# Patient Record
Sex: Female | Born: 1970 | Race: White | Hispanic: No | Marital: Married | Smoking: Never smoker
Health system: Southern US, Community
[De-identification: ages and names within clinical notes are randomized; demographics above are authoritative.]

## PROBLEM LIST (undated history)

## (undated) DIAGNOSIS — T8859XA Other complications of anesthesia, initial encounter: Secondary | ICD-10-CM

## (undated) DIAGNOSIS — T4145XA Adverse effect of unspecified anesthetic, initial encounter: Secondary | ICD-10-CM

## (undated) DIAGNOSIS — K589 Irritable bowel syndrome without diarrhea: Secondary | ICD-10-CM

## (undated) DIAGNOSIS — E119 Type 2 diabetes mellitus without complications: Secondary | ICD-10-CM

## (undated) DIAGNOSIS — N200 Calculus of kidney: Secondary | ICD-10-CM

## (undated) HISTORY — DX: Irritable bowel syndrome without diarrhea: K58.9

## (undated) HISTORY — PX: KIDNEY STONE SURGERY: SHX686

## (undated) HISTORY — PX: OTHER SURGICAL HISTORY: SHX169

## (undated) HISTORY — DX: Type 2 diabetes mellitus without complications: E11.9

## (undated) HISTORY — DX: Calculus of kidney: N20.0

---

## 2015-03-27 ENCOUNTER — Encounter (INDEPENDENT_AMBULATORY_CARE_PROVIDER_SITE_OTHER): Payer: Self-pay | Admitting: *Deleted

## 2015-04-07 ENCOUNTER — Encounter (INDEPENDENT_AMBULATORY_CARE_PROVIDER_SITE_OTHER): Payer: Self-pay | Admitting: *Deleted

## 2015-04-30 ENCOUNTER — Ambulatory Visit (INDEPENDENT_AMBULATORY_CARE_PROVIDER_SITE_OTHER): Payer: Self-pay | Admitting: Internal Medicine

## 2015-05-01 ENCOUNTER — Ambulatory Visit (INDEPENDENT_AMBULATORY_CARE_PROVIDER_SITE_OTHER): Payer: Self-pay | Admitting: Internal Medicine

## 2015-05-07 ENCOUNTER — Ambulatory Visit (INDEPENDENT_AMBULATORY_CARE_PROVIDER_SITE_OTHER): Payer: BLUE CROSS/BLUE SHIELD | Admitting: Internal Medicine

## 2015-05-07 ENCOUNTER — Encounter (INDEPENDENT_AMBULATORY_CARE_PROVIDER_SITE_OTHER): Payer: Self-pay | Admitting: *Deleted

## 2015-05-07 ENCOUNTER — Encounter (INDEPENDENT_AMBULATORY_CARE_PROVIDER_SITE_OTHER): Payer: Self-pay | Admitting: Internal Medicine

## 2015-05-07 VITALS — BP 110/68 | HR 84 | Temp 98.8°F | Ht 62.0 in | Wt 151.8 lb

## 2015-05-07 DIAGNOSIS — E119 Type 2 diabetes mellitus without complications: Secondary | ICD-10-CM | POA: Insufficient documentation

## 2015-05-07 DIAGNOSIS — R1310 Dysphagia, unspecified: Secondary | ICD-10-CM | POA: Diagnosis not present

## 2015-05-07 DIAGNOSIS — K589 Irritable bowel syndrome without diarrhea: Secondary | ICD-10-CM | POA: Diagnosis not present

## 2015-05-07 DIAGNOSIS — N200 Calculus of kidney: Secondary | ICD-10-CM | POA: Insufficient documentation

## 2015-05-07 MED ORDER — HYOSCYAMINE SULFATE 0.125 MG SL SUBL: 0.1250 mg | SUBLINGUAL_TABLET | SUBLINGUAL | Status: DC | PRN

## 2015-05-07 NOTE — Progress Notes (Addendum)
   Subjective:    Patient ID: Cassandra Anderson, female    DOB: 06/22/1970, 45 y.o.   MRN: [id number]  HPI Referred by [facility]  (Dr. Leeroy BockBalaji Desai)for IBS with Diarrhea. She has seen Dr. Aleene DavidsonSpainhour in the past for this. In January for about 2 weeks she had diarrhea with no appetite. She felt nausea. She tells me for the past 2 weeks, when she eats,  It feels like foods are lodging.  She feels like foods are lodging in her mid-esophagus. When this occurs, she will have nausea.   She says her IBS has increased. She is having on average x 2 a day. She does not have a BM every day. She has urgency when she has diarrhea.  After her hysterectomy she went from constipation/diarrhea.  Family hx of colon polyps.  Her last colonoscopy was 10/05/2010 by Dr. Aleene DavidsonSpainhour and was normal.  Appetite is good. She has weight loss which was intentional.  No acid reflux.   Diabetic x 1 year.  Review of Systems Past Medical History  Diagnosis Date  . Diabetes (HCC)   . IBS (irritable bowel syndrome)     Past Surgical History  Procedure Laterality Date  . Partial hysterectomy      2014: abdominal pain  . Kidney stone surgery      Lithrotrypsy,   . C section      x 2    Allergies  Allergen Reactions  . Other     Nuts. Tree nuts.     No current outpatient prescriptions on file prior to visit.   No current facility-administered medications on file prior to visit.   Current Outpatient Prescriptions  Medication Sig Dispense Refill  . chlorthalidone (HYGROTON) 25 MG tablet Take 25 mg by mouth daily.    Marland Kitchen. lisinopril (PRINIVIL,ZESTRIL) 2.5 MG tablet Take 2.5 mg by mouth daily.    . metFORMIN (GLUCOPHAGE-XR) 750 MG 24 hr tablet Take 750 mg by mouth every evening.    . sertraline (ZOLOFT) 25 MG tablet Take 25 mg by mouth as needed.    . sitaGLIPtin (JANUVIA) 50 MG tablet Take 50 mg by mouth daily.     No current facility-administered medications for this visit.        Objective:   Physical Exam Blood pressure 110/68, pulse 84, temperature 98.8 F (37.1 C), height 5\' 2"  (1.575 m), weight 151 lb 12.8 oz (68.856 kg). Alert and oriented. Skin warm and dry. Oral mucosa is moist.   . Sclera anicteric, conjunctivae is pink. Thyroid not enlarged. No cervical lymphadenopathy. Lungs clear. Heart regular rate and rhythm.  Abdomen is soft. Bowel sounds are positive. No hepatomegaly. No abdominal masses felt. No tenderness.  No edema to lower extremities.          Assessment & Plan:  IBS. Am going to try her on Levsin and she how she does.  She does not have any alarm symptoms. Her symptoms have remained the same. Colonoscopy in 2012 for IBS diarrhea by Dr. Aleene DavidsonSpainhour was normal.  Bloating: Samples of Nexium x 4 boxes and IBGARD x 5 given to patient.  Dysphagia: DG esophagram

## 2015-05-07 NOTE — Patient Instructions (Signed)
IBGARD samples. Rx for Levsin.  OV in 3 months

## 2015-05-08 ENCOUNTER — Encounter (INDEPENDENT_AMBULATORY_CARE_PROVIDER_SITE_OTHER): Payer: Self-pay

## 2015-05-08 ENCOUNTER — Ambulatory Visit (HOSPITAL_COMMUNITY)
Admission: RE | Admit: 2015-05-08 | Discharge: 2015-05-08 | Disposition: A | Discharge status: Home / Self Care | Payer: BLUE CROSS/BLUE SHIELD | Source: Ambulatory Visit | Attending: Internal Medicine | Admitting: Internal Medicine

## 2015-05-08 DIAGNOSIS — R1310 Dysphagia, unspecified: Secondary | ICD-10-CM | POA: Diagnosis not present

## 2015-05-21 ENCOUNTER — Encounter (INDEPENDENT_AMBULATORY_CARE_PROVIDER_SITE_OTHER): Payer: Self-pay | Admitting: *Deleted

## 2015-08-07 ENCOUNTER — Ambulatory Visit (INDEPENDENT_AMBULATORY_CARE_PROVIDER_SITE_OTHER): Payer: BLUE CROSS/BLUE SHIELD | Admitting: Internal Medicine

## 2015-08-14 ENCOUNTER — Ambulatory Visit (INDEPENDENT_AMBULATORY_CARE_PROVIDER_SITE_OTHER): Payer: BLUE CROSS/BLUE SHIELD | Admitting: Internal Medicine

## 2016-04-12 ENCOUNTER — Ambulatory Visit (INDEPENDENT_AMBULATORY_CARE_PROVIDER_SITE_OTHER): Payer: BLUE CROSS/BLUE SHIELD | Admitting: Internal Medicine

## 2016-04-12 ENCOUNTER — Encounter (INDEPENDENT_AMBULATORY_CARE_PROVIDER_SITE_OTHER): Payer: Self-pay | Admitting: Internal Medicine

## 2016-04-12 ENCOUNTER — Encounter (INDEPENDENT_AMBULATORY_CARE_PROVIDER_SITE_OTHER): Payer: Self-pay

## 2016-04-12 VITALS — BP 116/80 | HR 78 | Temp 98.6°F | Resp 18 | Ht 62.0 in | Wt 157.9 lb

## 2016-04-12 DIAGNOSIS — R1013 Epigastric pain: Secondary | ICD-10-CM | POA: Diagnosis not present

## 2016-04-12 DIAGNOSIS — K582 Mixed irritable bowel syndrome: Secondary | ICD-10-CM

## 2016-04-12 MED ORDER — DICYCLOMINE HCL 10 MG PO CAPS: 10.0000 mg | ORAL_CAPSULE | Freq: Two times a day (BID) | ORAL | 5 refills | Status: AC

## 2016-04-12 NOTE — Patient Instructions (Addendum)
Hemoccult 1. Gradually increase intake of fiber rich foods. Upper abdominal ultrasound to be scheduled.

## 2016-04-12 NOTE — Progress Notes (Signed)
Presenting complaint;  Epigastric pain and irregular bowel movements.  Subjective:  Cassandra Anderson is 46 year old Caucasian female who is here for scheduled visit. She was last seen in March 2017 by Ms. Setzer NP. She was felt to have IBS. She also had barium pill study but was normal. Patient states that she does not have dysphagia but she has postprandial midepigastric pain which may last from 30 minutes to several hours. This pain is associated with nausea and sporadic vomiting. He does not have heartburn. She has not experienced melena or rectal bleeding. She has occasional pain under the right rib cage which radiates towards right scapula. She takes ibuprofen no more than 3-4 times in a month. She takes 4-600 mg at a time. She continues to complain of irregular bowel movements. When she is constipated she may go 3 days without a bowel movement and when she has diarrhea she has as many as 4-5 stools and she also has urgency. She had an accident 1 year ago. She does not believe she is on a high fiber diet. She does not exercise regularly. She drinks alcohol occasionally but does not smoke cigarettes. She has been on metformin for about 2 years. She does not feel that she is having any side effects with metformin. He has gained 6 pounds since her last visit of March 2017. On her last visit she was given IBgard doesn't believe it helps. She does not remember if she took Nexium.   Current Medications: Outpatient Encounter Prescriptions as of 04/12/2016  Medication Sig  . chlorthalidone (HYGROTON) 25 MG tablet Take 25 mg by mouth daily.  Marland Kitchen. ibuprofen (ADVIL,MOTRIN) 200 MG tablet Take 200 mg by mouth as needed for headache.  . lisinopril (PRINIVIL,ZESTRIL) 2.5 MG tablet Take 2.5 mg by mouth daily.  . metFORMIN (GLUCOPHAGE-XR) 750 MG 24 hr tablet Take 750 mg by mouth every evening.  . sitaGLIPtin (JANUVIA) 100 MG tablet Take 100 mg by mouth daily.   . [DISCONTINUED] hyoscyamine (LEVSIN SL) 0.125 MG SL tablet  Place 1 tablet (0.125 mg total) under the tongue every 4 (four) hours as needed. (Patient not taking: Reported on 04/12/2016)  . [DISCONTINUED] sertraline (ZOLOFT) 25 MG tablet Take 25 mg by mouth as needed.   No facility-administered encounter medications on file as of 04/12/2016.      Objective: Blood pressure 116/80, pulse 78, temperature 98.6 F (37 C), temperature source Oral, resp. rate 18, height 5\' 2"  (1.575 m), weight 157 lb 14.4 oz (71.6 kg). Patient is alert and in no acute distress. Conjunctiva is pink. Sclera is nonicteric Oropharyngeal mucosa is normal. No neck masses or thyromegaly noted. Cardiac exam with regular rhythm normal S1 and S2. No murmur or gallop noted. Lungs are clear to auscultation. Abdomen is symmetrical. Bowel sounds are normal. On palpation abdomen is soft with mild tenderness in midepigastric region. No organomegaly or masses. No LE edema or clubbing noted.   Assessment:  #1. Postprandial epigastric pain. She does not have heartburn. Need to rule out gallbladder disease. His pain could be part and parcel of IBS. #2. Irritable bowel syndrome. She is having both diarrhea and constipation.   Plan:  Hemoccult 1. Upper abdominal ultrasound. Dicyclomine 10 mg twice a day. High fiber diet. Further recommendations to follow.

## 2016-04-20 ENCOUNTER — Telehealth (INDEPENDENT_AMBULATORY_CARE_PROVIDER_SITE_OTHER): Payer: Self-pay | Admitting: Internal Medicine

## 2016-04-20 NOTE — Telephone Encounter (Signed)
Dr.Rehman is waiitng for the DVD to arrive at our office. After he reviews the DVD he will call the patient to review results.

## 2016-04-20 NOTE — Telephone Encounter (Signed)
Patient called, stated that Dr. Karilyn Cotaehman called her yesterday with her ultrasound results, but he called her home number and not her cell phone so it was late when she got the message.  She would like someone to call her back, she would like to know her results.  [phone]

## 2016-04-21 NOTE — Telephone Encounter (Signed)
I called the patient and explained Cassandra Anderson's response to the encounter.  She stated that when Dr. Karilyn Cotaehman called her, he called her home number.  She requested that I take her home # out so she will not miss his call.  She was anxious to get the results.

## 2016-05-02 ENCOUNTER — Other Ambulatory Visit (INDEPENDENT_AMBULATORY_CARE_PROVIDER_SITE_OTHER): Payer: Self-pay | Admitting: Internal Medicine

## 2016-05-02 DIAGNOSIS — R9389 Abnormal findings on diagnostic imaging of other specified body structures: Secondary | ICD-10-CM

## 2016-05-03 ENCOUNTER — Encounter (INDEPENDENT_AMBULATORY_CARE_PROVIDER_SITE_OTHER): Payer: Self-pay

## 2016-05-09 ENCOUNTER — Ambulatory Visit (HOSPITAL_COMMUNITY)
Admission: RE | Admit: 2016-05-09 | Discharge: 2016-05-09 | Disposition: A | Discharge status: Home / Self Care | Payer: BLUE CROSS/BLUE SHIELD | Source: Ambulatory Visit | Attending: Internal Medicine | Admitting: Internal Medicine

## 2016-05-09 DIAGNOSIS — K760 Fatty (change of) liver, not elsewhere classified: Secondary | ICD-10-CM | POA: Insufficient documentation

## 2016-05-09 DIAGNOSIS — R160 Hepatomegaly, not elsewhere classified: Secondary | ICD-10-CM | POA: Insufficient documentation

## 2016-05-09 DIAGNOSIS — R9389 Abnormal findings on diagnostic imaging of other specified body structures: Secondary | ICD-10-CM

## 2016-05-09 DIAGNOSIS — R938 Abnormal findings on diagnostic imaging of other specified body structures: Secondary | ICD-10-CM | POA: Diagnosis present

## 2016-05-09 LAB — POCT I-STAT CREATININE: Creatinine, Ser: 0.6 mg/dL (ref 0.44–1.00)

## 2016-05-09 MED ORDER — GADOBENATE DIMEGLUMINE 529 MG/ML IV SOLN
15.0000 mL | Freq: Once | INTRAVENOUS | Status: AC | PRN
  Administered 2016-05-09: 14 mL via INTRAVENOUS

## 2016-05-10 ENCOUNTER — Telehealth (INDEPENDENT_AMBULATORY_CARE_PROVIDER_SITE_OTHER): Payer: Self-pay | Admitting: Internal Medicine

## 2016-05-10 NOTE — Telephone Encounter (Signed)
Dr.Rehman is aware and he is going to call the patient with her results.

## 2016-05-10 NOTE — Telephone Encounter (Signed)
Patient left a message wanting to see if Dr. Karilyn Cotaehman has her MRI results yet.  [phone]

## 2016-05-11 ENCOUNTER — Encounter (INDEPENDENT_AMBULATORY_CARE_PROVIDER_SITE_OTHER): Payer: Self-pay | Admitting: Internal Medicine

## 2016-05-12 ENCOUNTER — Other Ambulatory Visit (INDEPENDENT_AMBULATORY_CARE_PROVIDER_SITE_OTHER): Payer: Self-pay | Admitting: *Deleted

## 2016-05-12 DIAGNOSIS — R1013 Epigastric pain: Secondary | ICD-10-CM

## 2016-05-17 ENCOUNTER — Encounter (HOSPITAL_COMMUNITY): Payer: Self-pay

## 2016-05-17 ENCOUNTER — Encounter (HOSPITAL_COMMUNITY)
Admission: RE | Admit: 2016-05-17 | Discharge: 2016-05-17 | Disposition: A | Discharge status: Home / Self Care | Payer: BLUE CROSS/BLUE SHIELD | Source: Ambulatory Visit | Attending: Internal Medicine | Admitting: Internal Medicine

## 2016-05-17 DIAGNOSIS — R1013 Epigastric pain: Secondary | ICD-10-CM | POA: Diagnosis not present

## 2016-05-17 MED ORDER — TECHNETIUM TC 99M MEBROFENIN IV KIT
5.0000 | PACK | Freq: Once | INTRAVENOUS | Status: AC | PRN
  Administered 2016-05-17: 5.3 via INTRAVENOUS

## 2016-05-19 ENCOUNTER — Other Ambulatory Visit (INDEPENDENT_AMBULATORY_CARE_PROVIDER_SITE_OTHER): Payer: Self-pay | Admitting: *Deleted

## 2016-05-19 ENCOUNTER — Encounter (INDEPENDENT_AMBULATORY_CARE_PROVIDER_SITE_OTHER): Payer: Self-pay | Admitting: *Deleted

## 2016-05-19 DIAGNOSIS — R109 Unspecified abdominal pain: Secondary | ICD-10-CM | POA: Insufficient documentation

## 2016-06-23 ENCOUNTER — Encounter (HOSPITAL_COMMUNITY)
Admission: RE | Disposition: A | Discharge status: Home Health | Payer: Self-pay | Source: Ambulatory Visit | Attending: Internal Medicine

## 2016-06-23 ENCOUNTER — Encounter (HOSPITAL_COMMUNITY): Payer: Self-pay | Admitting: *Deleted

## 2016-06-23 ENCOUNTER — Ambulatory Visit (HOSPITAL_COMMUNITY)
Admission: RE | Admit: 2016-06-23 | Discharge: 2016-06-23 | Disposition: A | Discharge status: Home Health | Payer: BLUE CROSS/BLUE SHIELD | Source: Ambulatory Visit | Attending: Internal Medicine | Admitting: Internal Medicine

## 2016-06-23 DIAGNOSIS — Z7984 Long term (current) use of oral hypoglycemic drugs: Secondary | ICD-10-CM | POA: Diagnosis not present

## 2016-06-23 DIAGNOSIS — R109 Unspecified abdominal pain: Secondary | ICD-10-CM

## 2016-06-23 DIAGNOSIS — R197 Diarrhea, unspecified: Secondary | ICD-10-CM | POA: Diagnosis not present

## 2016-06-23 DIAGNOSIS — Z888 Allergy status to other drugs, medicaments and biological substances status: Secondary | ICD-10-CM | POA: Diagnosis not present

## 2016-06-23 DIAGNOSIS — Z8719 Personal history of other diseases of the digestive system: Secondary | ICD-10-CM | POA: Diagnosis not present

## 2016-06-23 DIAGNOSIS — E119 Type 2 diabetes mellitus without complications: Secondary | ICD-10-CM | POA: Diagnosis not present

## 2016-06-23 DIAGNOSIS — Z79899 Other long term (current) drug therapy: Secondary | ICD-10-CM | POA: Insufficient documentation

## 2016-06-23 DIAGNOSIS — R1013 Epigastric pain: Secondary | ICD-10-CM | POA: Insufficient documentation

## 2016-06-23 HISTORY — DX: Other complications of anesthesia, initial encounter: T88.59XA

## 2016-06-23 HISTORY — DX: Adverse effect of unspecified anesthetic, initial encounter: T41.45XA

## 2016-06-23 HISTORY — PX: BIOPSY: SHX5522

## 2016-06-23 HISTORY — PX: ESOPHAGOGASTRODUODENOSCOPY: SHX5428

## 2016-06-23 LAB — GLUCOSE, CAPILLARY: Glucose-Capillary: 202 mg/dL — ABNORMAL HIGH (ref 65–99)

## 2016-06-23 SURGERY — EGD (ESOPHAGOGASTRODUODENOSCOPY)
Anesthesia: Moderate Sedation

## 2016-06-23 MED ORDER — MIDAZOLAM HCL 5 MG/5ML IJ SOLN
INTRAMUSCULAR | Status: DC | PRN
  Administered 2016-06-23: 1 mg via INTRAVENOUS
  Administered 2016-06-23 (×4): 2 mg via INTRAVENOUS

## 2016-06-23 MED ORDER — LIDOCAINE VISCOUS 2 % MT SOLN
OROMUCOSAL | Status: DC | PRN
  Administered 2016-06-23: 3 mL via OROMUCOSAL

## 2016-06-23 MED ORDER — MEPERIDINE HCL 50 MG/ML IJ SOLN
INTRAMUSCULAR | Status: AC
  Filled 2016-06-23: qty 1

## 2016-06-23 MED ORDER — LIDOCAINE VISCOUS 2 % MT SOLN
OROMUCOSAL | Status: AC
  Filled 2016-06-23: qty 15

## 2016-06-23 MED ORDER — MIDAZOLAM HCL 5 MG/5ML IJ SOLN
INTRAMUSCULAR | Status: AC
  Filled 2016-06-23: qty 10

## 2016-06-23 MED ORDER — SODIUM CHLORIDE 0.9 % IV SOLN
INTRAVENOUS | Status: DC
  Administered 2016-06-23: 08:00:00 via INTRAVENOUS

## 2016-06-23 MED ORDER — STERILE WATER FOR IRRIGATION IR SOLN
Status: DC | PRN
  Administered 2016-06-23: 08:00:00

## 2016-06-23 MED ORDER — MEPERIDINE HCL 50 MG/ML IJ SOLN
INTRAMUSCULAR | Status: DC | PRN
  Administered 2016-06-23 (×2): 25 mg via INTRAVENOUS

## 2016-06-23 NOTE — Discharge Instructions (Signed)
Resume usual medications and diet. Take dicyclomine every morning and every other morning. No driving for 24 hours. Physician will call with biopsy results.  Esophagogastroduodenoscopy, Care After Refer to this sheet in the next few weeks. These instructions provide you with information about caring for yourself after your procedure. Your health care provider may also give you more specific instructions. Your treatment has been planned according to current medical practices, but problems sometimes occur. Call your health care provider if you have any problems or questions after your procedure. What can I expect after the procedure? After the procedure, it is common to have:  A sore throat.  Nausea.  Bloating.  Dizziness.  Fatigue. Follow these instructions at home:  Do not eat or drink anything until the numbing medicine (local anesthetic) has worn off and your gag reflex has returned. You will know that the local anesthetic has worn off when you can swallow comfortably.  Do not drive for 24 hours if you received a medicine to help you relax (sedative).  If your health care provider took a tissue sample for testing during the procedure, make sure to get your test results. This is your responsibility. Ask your health care provider or the department performing the test when your results will be ready.  Keep all follow-up visits as told by your health care provider. This is important. Contact a health care provider if:  You cannot stop coughing.  You are not urinating.  You are urinating less than usual. Get help right away if:  You have trouble swallowing.  You cannot eat or drink.  You have throat or chest pain that gets worse.  You are dizzy or light-headed.  You faint.  You have nausea or vomiting.  You have chills.  You have a fever.  You have severe abdominal pain.  You have black, tarry, or bloody stools.

## 2016-06-23 NOTE — H&P (Signed)
Cassandra Anderson is an 46 y.o. female.   Chief Complaint: Patient is here for EGD. HPI: Agents 46 year old Caucasian female was intermittent/chronic epigastric pain. She did not respond to PPI. She has undergone workup for biliary tract disease and was negative. Along the way she was found to have hepatic lesions and MR confirmed these to be benign. She also has intermittent diarrhea. When she has used pelvis she has nausea. She has not lost any weight. She has tried dicyclomine but it makes her constipated. She denies melena or rectal bleeding.  Past Medical History:  Diagnosis Date  . Complication of anesthesia    post op nausea and vomiting.   . Diabetes (HCC)   . IBS (irritable bowel syndrome)   . Kidney stones     Past Surgical History:  Procedure Laterality Date  . c section     x 2  . KIDNEY STONE SURGERY     Lithrotrypsy,   . partial hysterectomy     2014: abdominal pain    History reviewed. No pertinent family history. Social History:  reports that she has never smoked. She has never used smokeless tobacco. She reports that she does not drink alcohol or use drugs.  Allergies:  Allergies  Allergen Reactions  . Propofol Nausea And Vomiting  . Other Other (See Comments)     Tree nuts.   Patient has skin testing and this tested positive.    Medications Prior to Admission  Medication Sig Dispense Refill  . chlorthalidone (HYGROTON) 25 MG tablet Take 25 mg by mouth daily.    Marland Kitchen. ibuprofen (ADVIL,MOTRIN) 200 MG tablet Take 400-800 mg by mouth 2 (two) times daily as needed for headache or mild pain.     Marland Kitchen. lisinopril (PRINIVIL,ZESTRIL) 2.5 MG tablet Take 2.5 mg by mouth daily.    . metFORMIN (GLUCOPHAGE-XR) 750 MG 24 hr tablet Take 750 mg by mouth 2 (two) times daily.     . sitaGLIPtin (JANUVIA) 100 MG tablet Take 100 mg by mouth daily.     Marland Kitchen. dicyclomine (BENTYL) 10 MG capsule Take 1 capsule (10 mg total) by mouth 2 (two) times daily before a meal. (Patient not taking: Reported on  06/21/2016) 60 capsule 5    Results for orders placed or performed during the hospital encounter of 06/23/16 (from the past 48 hour(s))  Glucose, capillary     Status: Abnormal   Collection Time: 06/23/16  7:48 AM  Result Value Ref Range   Glucose-Capillary 202 (H) 65 - 99 mg/dL   No results found.  ROS  Blood pressure 119/76, pulse 99, resp. rate 19, SpO2 98 %. Physical Exam  Constitutional: She appears well-developed and well-nourished.  HENT:  Mouth/Throat: Oropharynx is clear and moist.  Eyes: Conjunctivae are normal. No scleral icterus.  Neck: No thyromegaly present.  Cardiovascular: Normal rate, regular rhythm and normal heart sounds.   No murmur heard. Respiratory: Effort normal and breath sounds normal.  GI: Soft. She exhibits no distension and no mass. There is no tenderness.  Musculoskeletal: She exhibits no edema.  Lymphadenopathy:    She has no cervical adenopathy.  Neurological: She is alert.  Skin: Skin is warm and dry.     Assessment/Plan Epigastric pain. Diagnostic EGD.  Lionel DecemberNajeeb Tailyn Hantz, MD 06/23/2016, 8:20 AM

## 2016-06-23 NOTE — Op Note (Signed)
[facility] Patient Name: Cassandra Anderson Procedure Date: 06/23/2016 8:18 AM MRN: [id number] Date of Birth: 1970/06/14 Attending MD: Lionel December , MD CSN: [id number] Age: 46 Admit Type: Outpatient Procedure:                Upper GI endoscopy Indications:              Epigastric abdominal pain Providers:                Lionel December, MD, Loma Messing B. Patsy Lager, RN, Burke Keels, Technician Referring MD:             Aggie Cosier, MD Medicines:                Meperidine 50 mg IV, Lidocaine spray, Midazolam 9                            mg IV Complications:            No immediate complications. Estimated Blood Loss:     Estimated blood loss was minimal. Procedure:                Pre-Anesthesia Assessment:                           - Prior to the procedure, a History and Physical                            was performed, and patient medications and                            allergies were reviewed. The patient's tolerance of                            previous anesthesia was also reviewed. The risks                            and benefits of the procedure and the sedation                            options and risks were discussed with the patient.                            All questions were answered, and informed consent                            was obtained. Prior Anticoagulants: The patient has                            taken no previous anticoagulant or antiplatelet                            agents. ASA Grade Assessment: II - A patient with  mild systemic disease. After reviewing the risks                            and benefits, the patient was deemed in                            satisfactory condition to undergo the procedure.                           After obtaining informed consent, the endoscope was                            passed under direct vision. Throughout the                            procedure, the patient's  blood pressure, pulse, and                            oxygen saturations were monitored continuously. The                            [id number] ([id number]) scope was introduced through the                            mouth, and advanced to the second part of duodenum.                            The upper GI endoscopy was accomplished without                            difficulty. The patient tolerated the procedure                            well. Scope In: 8:30:40 AM Scope Out: 8:39:58 AM Total Procedure Duration: 0 hours 9 minutes 18 seconds  Findings:      The examined esophagus was normal.      The Z-line was regular and was found 35 cm from the incisors.      The entire examined stomach was normal.      The duodenal bulb and second portion of the duodenum were normal.       Biopsies were taken with a cold forceps for histology. Biopsies for       histology were taken with a cold forceps for evaluation of celiac       disease. Impression:               - Normal esophagus.                           - Z-line regular, 35 cm from the incisors.                           - Normal stomach.                           - Normal duodenal bulb and second  portion of the                            duodenum. Biopsied. Moderate Sedation:      Moderate (conscious) sedation was administered by the endoscopy nurse       and supervised by the endoscopist. The following parameters were       monitored: oxygen saturation, heart rate, blood pressure, CO2       capnography and response to care. Total physician intraservice time was       14 minutes. Recommendation:           - Patient has a contact number available for                            emergencies. The signs and symptoms of potential                            delayed complications were discussed with the                            patient. Return to normal activities tomorrow.                            Written discharge instructions were provided  to the                            patient.                           - Resume previous diet.                           - Continue present medications.                           - Await pathology results. Procedure Code(s):        --- Professional ---                           [phone], Esophagogastroduodenoscopy, flexible,                            transoral; with biopsy, single or multiple                           99152, Moderate sedation services provided by the                            same physician or other qualified health care                            professional performing the diagnostic or                            therapeutic service that the sedation supports,  requiring the presence of an independent trained                            observer to assist in the monitoring of the                            patient's level of consciousness and physiological                            status; initial 15 minutes of intraservice time,                            patient age 57 years or older Diagnosis Code(s):        --- Professional ---                           R10.13, Epigastric pain CPT copyright 2016 [employer] Association. All rights reserved. The codes documented in this report are preliminary and upon coder review may  be revised to meet current compliance requirements. Lionel December, MD Lionel December, MD 06/23/2016 8:48:06 AM This report has been signed electronically. Number of Addenda: 0

## 2016-06-28 NOTE — Progress Notes (Signed)
A message has been sent to [facility] to open a spot for this patient for 3 months.

## 2016-06-29 ENCOUNTER — Encounter (INDEPENDENT_AMBULATORY_CARE_PROVIDER_SITE_OTHER): Payer: Self-pay | Admitting: Internal Medicine

## 2016-06-30 ENCOUNTER — Encounter (HOSPITAL_COMMUNITY): Payer: Self-pay | Admitting: Internal Medicine

## 2016-07-26 ENCOUNTER — Ambulatory Visit (INDEPENDENT_AMBULATORY_CARE_PROVIDER_SITE_OTHER): Payer: BLUE CROSS/BLUE SHIELD | Admitting: Internal Medicine

## 2016-09-27 ENCOUNTER — Telehealth (INDEPENDENT_AMBULATORY_CARE_PROVIDER_SITE_OTHER): Payer: Self-pay | Admitting: Internal Medicine

## 2016-09-27 ENCOUNTER — Ambulatory Visit (INDEPENDENT_AMBULATORY_CARE_PROVIDER_SITE_OTHER): Payer: BLUE CROSS/BLUE SHIELD | Admitting: Internal Medicine

## 2016-09-27 NOTE — Telephone Encounter (Signed)
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  She agreed to come in on Terri's schedule for 10/11/16, but wants Dr. Karilyn Cotaehman to come in the room during the visit.  I've noted that on her appointment.  She stated that the medication that Dr. Karilyn Cotaehman put her one for diarrhea has caused her to be constipated.  She therefore stopped it, but continues to be constipated.  She did state that the medication helped with the stomach pain.  She would like to know his recommendations.  [phone]

## 2016-09-28 NOTE — Telephone Encounter (Signed)
Per Dr.Rehman the patient is currently on the lowest dose. She may use Miralax 1/2 cap daily or take colace1 by mouth daily. Patient was called and made aware.

## 2016-09-28 NOTE — Telephone Encounter (Signed)
To be discussed with Dr.Rehman. 

## 2016-10-11 ENCOUNTER — Encounter (INDEPENDENT_AMBULATORY_CARE_PROVIDER_SITE_OTHER): Payer: Self-pay | Admitting: Internal Medicine

## 2016-10-11 ENCOUNTER — Ambulatory Visit (INDEPENDENT_AMBULATORY_CARE_PROVIDER_SITE_OTHER): Payer: BLUE CROSS/BLUE SHIELD | Admitting: Internal Medicine

## 2016-10-11 VITALS — BP 104/80 | HR 64 | Temp 97.8°F | Ht 61.5 in | Wt 155.8 lb

## 2016-10-11 DIAGNOSIS — K588 Other irritable bowel syndrome: Secondary | ICD-10-CM

## 2016-10-11 NOTE — Patient Instructions (Signed)
OV in 1 year.  

## 2016-10-11 NOTE — Progress Notes (Signed)
Subjective:    Patient ID: Cassandra Anderson, female    DOB: [birth year], 46 y.o.   MRN: [id number]  HPI Here today for f/u. Underwent and EGD in May for 2018  for epigastric pain. Impression:               - Normal esophagus.                           - Z-line regular, 35 cm from the incisors.                           - Normal stomach.                           - Normal duodenal bulb and second portion of the                            duodenum. Biopsied. Hx of postprandial epigastric pain and IBS/diarrhea/constipation.  She was advised by Dr. Karilyn Cota to take Dicyclomine 10mg  BID. High fiber diet.  Her last colonoscopy was 10/05/2010 by Dr. Aleene Davidson and was normal.  She tells me with the Dicyclomine it can cause some constipation. She take the Dicyclomine every other day.  She is having a BM every day if she doesn't take the Dicyclomine. If on the Dicyclomine she will  Have a BM every 2-3 days. Her appetite is good.  She says her abdominal pain is better. She says she is 40-50% better.  She is not taking the Miralax. She is not drinking plenty of fluids. Diabetic 2016. Review of Systems Past Medical History:  Diagnosis Date  . Complication of anesthesia    post op nausea and vomiting.   . Diabetes (HCC)   . IBS (irritable bowel syndrome)   . Kidney stones     Past Surgical History:  Procedure Laterality Date  . BIOPSY N/A 06/23/2016   Procedure: Duodenal Biopsy ;  Surgeon: Malissa Hippo, MD;  Location: [facility];  Service: Endoscopy;  Laterality: N/A;  . c section     x 2  . ESOPHAGOGASTRODUODENOSCOPY N/A 06/23/2016   Procedure: ESOPHAGOGASTRODUODENOSCOPY (EGD);  Surgeon: Malissa Hippo, MD;  Location: [facility];  Service: Endoscopy;  Laterality: N/A;  1:45  . KIDNEY STONE SURGERY     Lithrotrypsy,   . partial hysterectomy     2014: abdominal pain    Allergies  Allergen Reactions  . Propofol Nausea And Vomiting  . Other Other (See Comments)     Tree nuts.   Patient  has skin testing and this tested positive.    Current Outpatient Prescriptions on File Prior to Visit  Medication Sig Dispense Refill  . dicyclomine (BENTYL) 10 MG capsule Take 1 capsule (10 mg total) by mouth 2 (two) times daily before a meal. 60 capsule 5  . ibuprofen (ADVIL,MOTRIN) 200 MG tablet Take 400-800 mg by mouth 2 (two) times daily as needed for headache or mild pain.     Marland Kitchen lisinopril (PRINIVIL,ZESTRIL) 2.5 MG tablet Take 2.5 mg by mouth daily.    . metFORMIN (GLUCOPHAGE-XR) 750 MG 24 hr tablet Take 750 mg by mouth 2 (two) times daily.     . sitaGLIPtin (JANUVIA) 100 MG tablet Take 100 mg by mouth daily.      No current facility-administered medications on file prior  to visit.         Objective:   Physical Exam .Blood pressure 104/80, pulse 64, temperature 97.8 F (36.6 C), height 5' 1.5" (1.562 m), weight 155 lb 12.8 oz (70.7 kg). Alert and oriented. Skin warm and dry. Oral mucosa is moist.   . Sclera anicteric, conjunctivae is pink. Thyroid not enlarged. No cervical lymphadenopathy. Lungs clear. Heart regular rate and rhythm.  Abdomen is soft. Bowel sounds are positive. No hepatomegaly. No abdominal masses felt. No tenderness.  No edema to lower extremities.           Assessment & Plan:  IBS: continue the Dicyclomine as needed. OV  As needed.

## 2016-10-14 ENCOUNTER — Encounter (INDEPENDENT_AMBULATORY_CARE_PROVIDER_SITE_OTHER): Payer: Self-pay | Admitting: *Deleted

## 2016-10-27 ENCOUNTER — Telehealth (INDEPENDENT_AMBULATORY_CARE_PROVIDER_SITE_OTHER): Payer: Self-pay | Admitting: *Deleted

## 2016-10-27 ENCOUNTER — Encounter (INDEPENDENT_AMBULATORY_CARE_PROVIDER_SITE_OTHER): Payer: Self-pay | Admitting: *Deleted

## 2016-10-27 DIAGNOSIS — K769 Liver disease, unspecified: Secondary | ICD-10-CM

## 2016-10-27 NOTE — Telephone Encounter (Signed)
Patient on recall for 6 mth MRI -- needed order -- MRI abd w/ w/o dx: liver lesion

## 2016-11-04 ENCOUNTER — Ambulatory Visit (HOSPITAL_COMMUNITY)
Admission: RE | Admit: 2016-11-04 | Discharge: 2016-11-04 | Disposition: A | Discharge status: Home / Self Care | Payer: BLUE CROSS/BLUE SHIELD | Source: Ambulatory Visit | Attending: Internal Medicine | Admitting: Internal Medicine

## 2016-11-04 DIAGNOSIS — K760 Fatty (change of) liver, not elsewhere classified: Secondary | ICD-10-CM | POA: Diagnosis not present

## 2016-11-04 DIAGNOSIS — K769 Liver disease, unspecified: Secondary | ICD-10-CM | POA: Diagnosis not present

## 2016-11-04 LAB — POCT I-STAT CREATININE: Creatinine, Ser: 0.5 mg/dL (ref 0.44–1.00)

## 2016-11-04 MED ORDER — GADOXETATE DISODIUM 0.25 MMOL/ML IV SOLN
10.0000 mL | Freq: Once | INTRAVENOUS | Status: AC | PRN
  Administered 2016-11-04: 7 mL via INTRAVENOUS

## 2017-10-10 ENCOUNTER — Ambulatory Visit (INDEPENDENT_AMBULATORY_CARE_PROVIDER_SITE_OTHER): Payer: BLUE CROSS/BLUE SHIELD | Admitting: Internal Medicine

## 2017-11-22 IMAGING — MR MR ABDOMEN WO/W CM
16 of 18 series · 41 of 48 positions shown · IV contrast (6ml Multihance)
Comparison: MRI of the abdomen 05/09/2016.

CLINICAL DATA: 45-year-old female with history of multiple hepatic
lesions noted on prior examination. Followup study.

EXAM:
MRI ABDOMEN WITHOUT AND WITH CONTRAST
TECHNIQUE: Multiplanar multisequence MR imaging of the abdomen was performed
both before and after the administration of intravenous contrast.
CONTRAST:  7mL EOVIST GADOXETATE DISODIUM 0.25 MOL/L IV SOLN

[Series 5: T2 · coronal · 5.0mm · 1.11mm/px · 1 of 36 slices shown (1 of 2)]
[im 1/36]
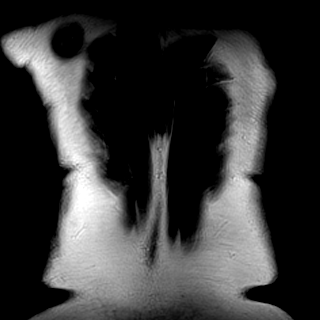

[Series 6: T1 · axial · 4.0mm · 0.59mm/px · z∈[-94,+158]mm · 2 of 64 slices shown (1 of 2)]
[im 1/64]
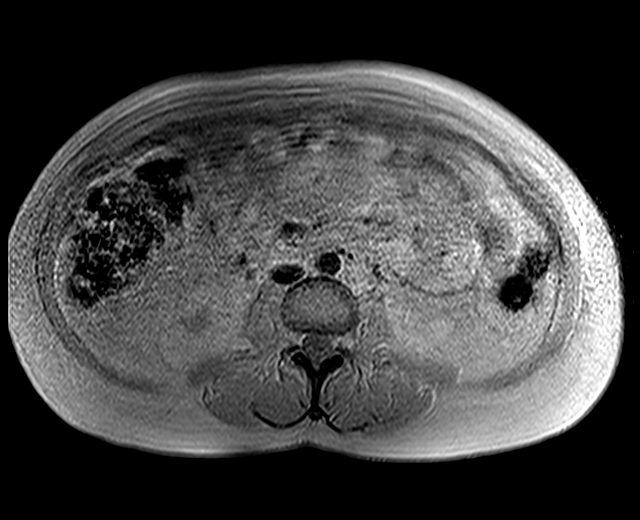
[im 64/64]
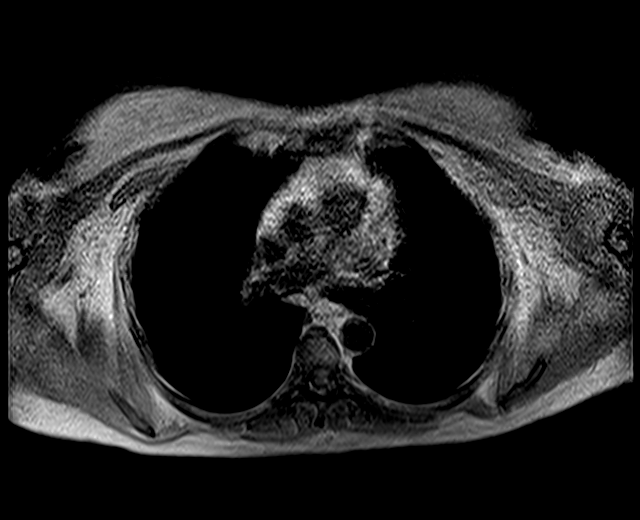

[Series 7: T1 · axial · 4.0mm · 0.59mm/px · z∈[-94,+158]mm · 2 of 64 slices shown (2 of 2)]
[im 1/64]
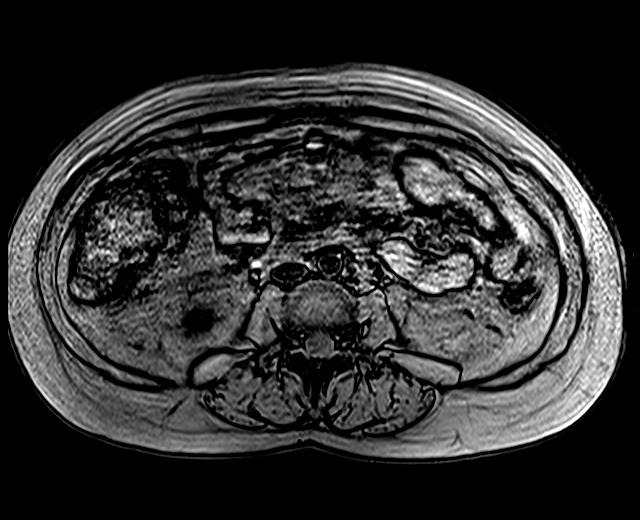
[im 64/64]
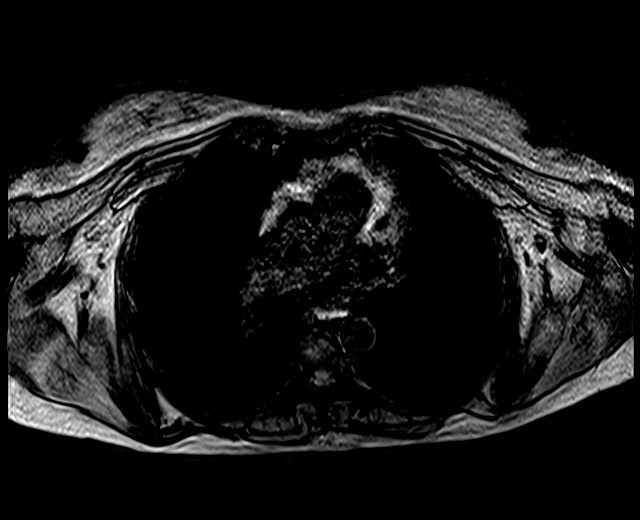

[Series 10: pre test · axial · non-contrast · 3.5mm · 1.19mm/px · z∈[-96,+152]mm · 3 of 72 slices shown]
[im 1/72]
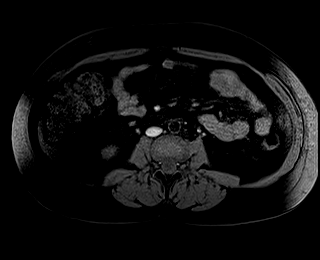
[im 36/72]
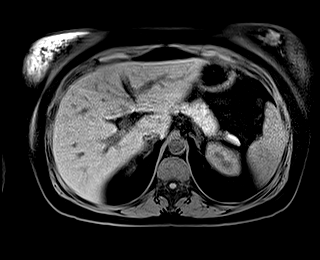
[im 72/72]
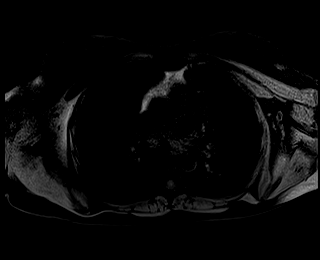

[Series 18: t1fs coronal post · coronal · 2.5mm · 1.20mm/px · 4 of 96 slices shown]
[im 1/96]
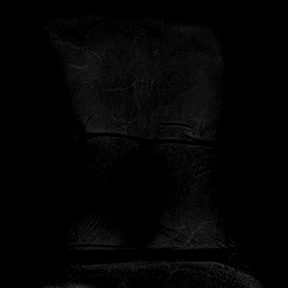
[im 32/96]
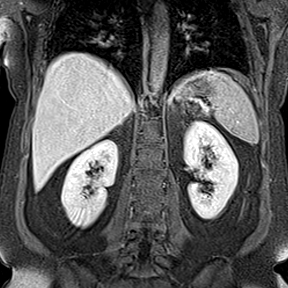
[im 64/96]
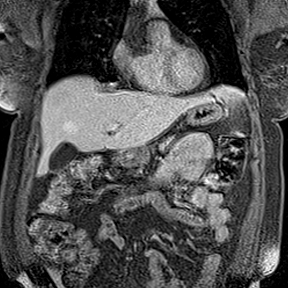
[im 96/96]
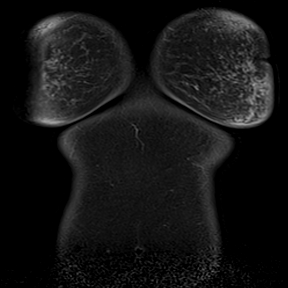

[Series 19: t2fs axial blade · axial · 4.0mm · 1.06mm/px · z∈[-81,+144]mm · 2 of 48 slices shown]
[im 1/48]
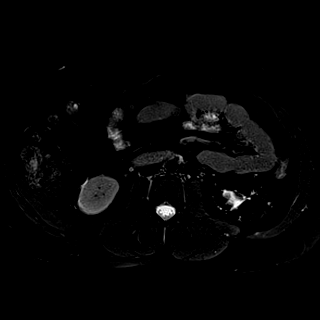
[im 48/48]
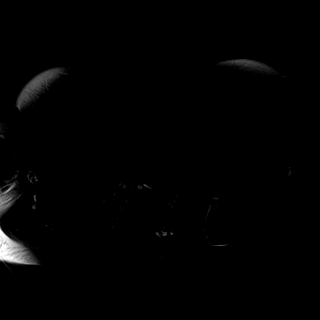

[Series 20: T2 · axial · 5.0mm · 1.31mm/px · z∈[-85,+149]mm · 2 of 40 slices shown (2 of 2)]
[im 1/40]
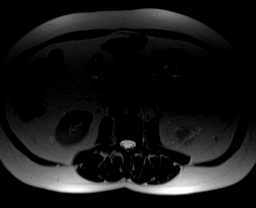
[im 40/40]
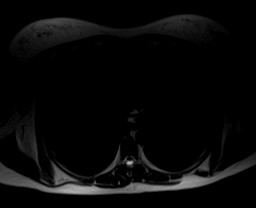

[Series 21: bSSFP · axial · 4.0mm · 1.37mm/px · z∈[-106,+170]mm · 3 of 70 slices shown]
[im 1/70]
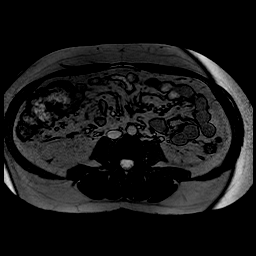
[im 35/70]
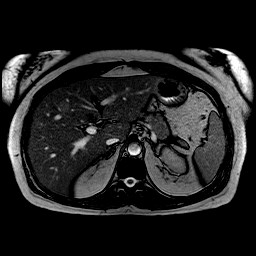
[im 70/70]
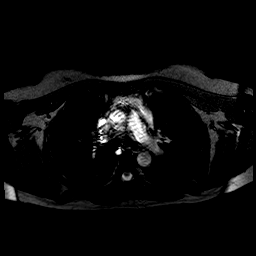

[Series 22: DWI · axial · 5.0mm · 0.99mm/px · z∈[-85,+149]mm · 3 of 80 slices shown (1 of 2)]
[im 1/80]
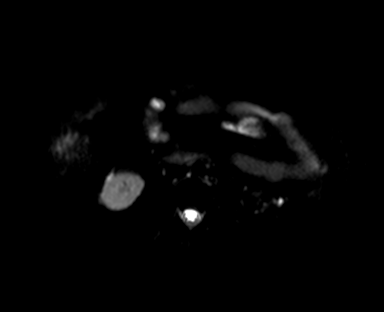
[im 40/80]
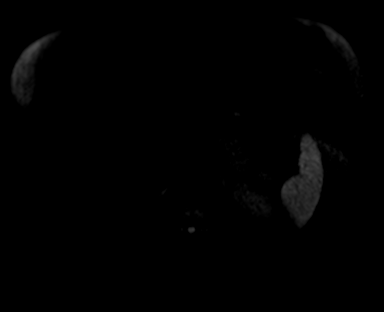
[im 80/80]
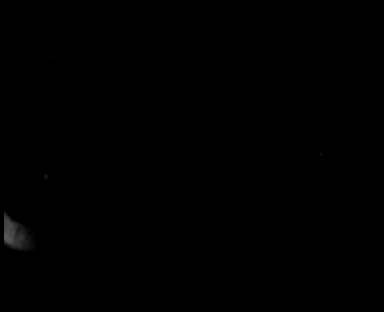

[Series 23: DWI · axial · 5.0mm · 0.99mm/px · z∈[-85,+149]mm · 2 of 40 slices shown (2 of 2)]
[im 1/40]
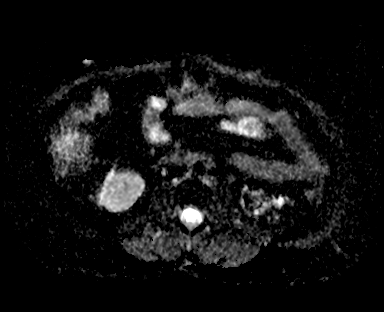
[im 40/40]
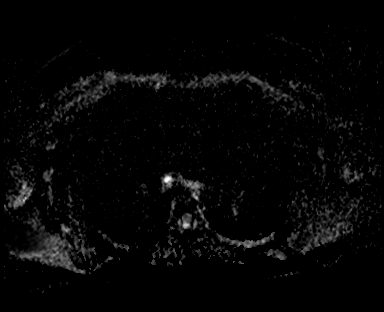

[Series 24: 20 min delay · axial · delayed · 3.5mm · 1.19mm/px · z∈[-96,+152]mm · 3 of 72 slices shown]
[im 1/72]
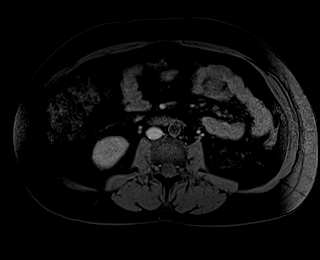
[im 36/72]
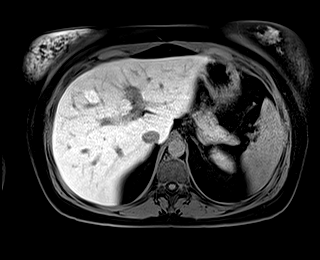
[im 72/72]
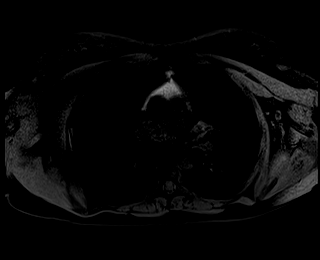

[Series 25: 20 min delay_sub · axial · 3.5mm · 1.19mm/px · z∈[-96,+152]mm · 3 of 72 slices shown]
[im 1/72]
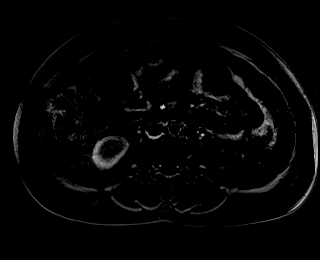
[im 36/72]
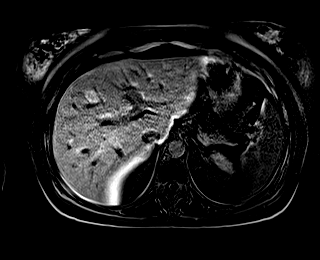
[im 72/72]
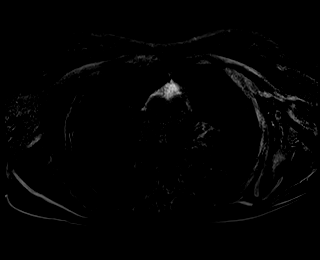

[Series 5003: (id) · axial · 3.5mm · 1.19mm/px · z∈[-96,+152]mm · 3 of 72 slices shown (1 of 4)]
[im 1/72]
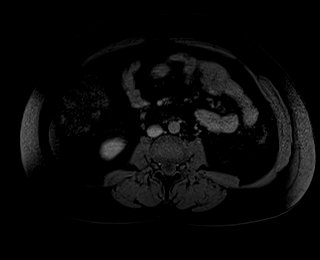
[im 36/72]
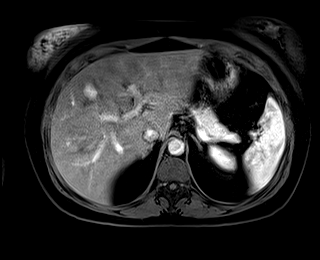
[im 72/72]
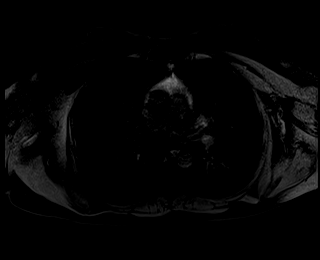

[Series 5004: (id) · axial · 3.5mm · 1.19mm/px · z∈[-96,+152]mm · 3 of 72 slices shown (2 of 4)]
[im 1/72]
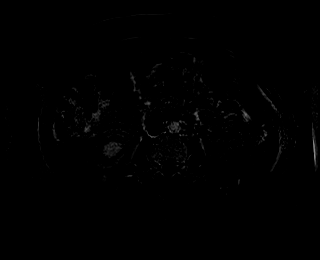
[im 36/72]
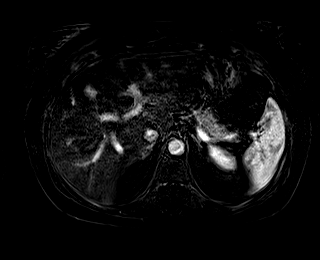
[im 72/72]
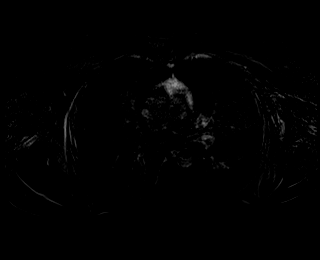

[Series 5005: (id) · axial · 3.5mm · 1.19mm/px · z∈[-96,+152]mm · 3 of 72 slices shown (3 of 4)]
[im 1/72]
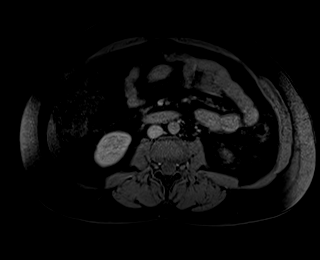
[im 36/72]
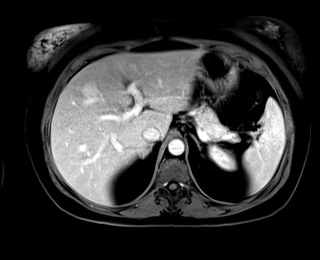
[im 72/72]
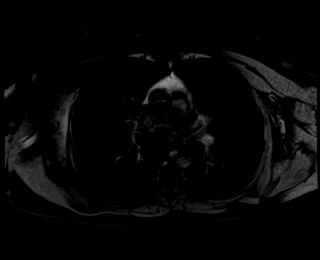

[Series 5006: (id) · axial · 3.5mm · 1.19mm/px · z∈[-96,+26]mm · 2 of 72 slices shown (4 of 4)]
[im 1/72]
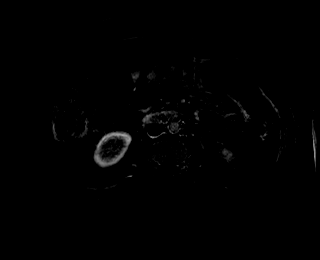
[im 36/72]
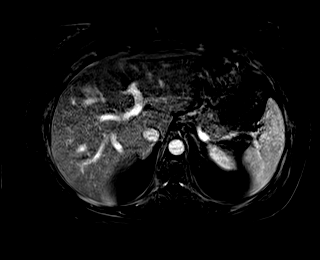

[41 of 48 positions shown; findings below may reference images not displayed]

FINDINGS: Lower chest: Unremarkable.

Hepatobiliary: Mild diffuse loss of signal intensity throughout the
hepatic parenchyma on out of phase dual echo images, compatible with
hepatic steatosis. Again noted are 3 hypervascular hepatic lesions
in segment 4A and segment 7. These are similar in size in appearance
to the prior study, with the largest of these lesions in segment 4A
a measuring up to 2.3 x 1.6 cm (axial image 36 of series 3884).
These lesions are isointense on T1 weighted images, very slightly T2
hyperintense, demonstrate diffuse arterial phase hyperenhancement
with persistence of enhancement on 20 minutes delayed post Eovist
imaging, diagnostic of focal nodular hyperplasia. No other
suspicious hepatic lesions are noted. No intra or extrahepatic
biliary ductal dilatation. Gallbladder is normal in appearance.

Pancreas: No pancreatic mass. No pancreatic ductal dilatation. No
pancreatic or peripancreatic fluid or inflammatory changes.

Spleen:  Unremarkable.

Adrenals/Urinary Tract: Bilateral kidneys and bilateral adrenal
glands are normal in appearance. There is no hydroureteronephrosis
in the visualized portions of the abdomen.

Stomach/Bowel: Visualized portions are unremarkable.

Vascular/Lymphatic: No aneurysm identified in the visualized
abdominal vasculature. No lymphadenopathy noted in the abdomen.

Other: No significant volume of ascites noted in the visualized
portions of the peritoneal cavity.

Musculoskeletal: No aggressive appearing osseous lesions are noted
in the visualized portions of the skeleton.
IMPRESSION: 1. Three hypervascular hepatic lesions are again noted, as detailed
above, with imaging findings compatible with focal nodular
hyperplasia (FNH).
2. Mild hepatic steatosis.

## 2019-08-07 ENCOUNTER — Ambulatory Visit (INDEPENDENT_AMBULATORY_CARE_PROVIDER_SITE_OTHER): Payer: BLUE CROSS/BLUE SHIELD | Admitting: Gastroenterology
